# Patient Record
Sex: Female | Born: 1990 | Race: Black or African American | Hispanic: No | Marital: Married | Smoking: Never smoker
Health system: Southern US, Community
[De-identification: ages and names within clinical notes are randomized; demographics above are authoritative.]

## PROBLEM LIST (undated history)

## (undated) DIAGNOSIS — Z789 Other specified health status: Secondary | ICD-10-CM

## (undated) HISTORY — DX: Other specified health status: Z78.9

## (undated) HISTORY — PX: NO PAST SURGERIES: SHX2092

---

## 2016-06-17 ENCOUNTER — Emergency Department (HOSPITAL_COMMUNITY)
Admission: EM | Admit: 2016-06-17 | Discharge: 2016-06-17 | Disposition: A | Discharge status: Home / Self Care | Payer: No Typology Code available for payment source | Attending: Emergency Medicine | Admitting: Emergency Medicine

## 2016-06-17 ENCOUNTER — Emergency Department (HOSPITAL_COMMUNITY): Payer: No Typology Code available for payment source

## 2016-06-17 ENCOUNTER — Encounter (HOSPITAL_COMMUNITY): Payer: Self-pay | Admitting: Emergency Medicine

## 2016-06-17 DIAGNOSIS — S8992XD Unspecified injury of left lower leg, subsequent encounter: Secondary | ICD-10-CM | POA: Diagnosis present

## 2016-06-17 DIAGNOSIS — S8012XD Contusion of left lower leg, subsequent encounter: Secondary | ICD-10-CM

## 2016-06-17 NOTE — ED Notes (Signed)
Papers reviewed with interpreter on the phone and patient demonstrated the use of crutches with ortho tech and verbalizes understanding

## 2016-06-17 NOTE — Discharge Instructions (Signed)
There were no abnormalities on the x-ray today. You may have sustained a deep bruise. This type of injury can sometimes take several weeks to heal. Take 500 mg of naproxen every 12 hours or 800 mg of ibuprofen every 8 hours for the next 3 days. Take these medications with food to avoid upset stomach. Follow up with a primary care provider for any future management of these complaints. Keep the extremity elevated whenever possible. Crutches and Ace wrap for comfort. Weightbearing as tolerated.
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
## 2016-06-17 NOTE — Progress Notes (Signed)
Orthopedic Tech Progress Note Patient Details:  Erika Jimenez 12/10/1990 [id number]  Ortho Devices Type of Ortho Device: Crutches Ortho Device/Splint Interventions: Application   [name] 06/17/2016, 11:18 AM

## 2016-06-17 NOTE — Discharge Planning (Signed)
Pt up for discharge. EDCM reviewed chart for possible CM needs.  No needs identified or communicated.  

## 2016-06-17 NOTE — ED Notes (Signed)
Paged ortho and they are bringing the appropriate size crutches
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
## 2016-06-17 NOTE — Progress Notes (Signed)
Orthopedic Tech Progress Note Patient Details:  Erika Jimenez 01/12/1991 [id number]  Patient ID: Erika Jimenez, female   DOB: 09/20/1990, 26 y.o.   MRN: [id number]   [name], [name] 06/17/2016, 11:34 AM Interpreter Trula Orehristina [phone]

## 2016-07-12 ENCOUNTER — Other Ambulatory Visit: Payer: Self-pay | Admitting: Infectious Disease

## 2016-07-12 ENCOUNTER — Ambulatory Visit
Admission: RE | Admit: 2016-07-12 | Discharge: 2016-07-12 | Disposition: A | Discharge status: Home / Self Care | Payer: No Typology Code available for payment source | Source: Ambulatory Visit | Attending: Infectious Disease | Admitting: Infectious Disease

## 2016-07-12 DIAGNOSIS — R7612 Nonspecific reaction to cell mediated immunity measurement of gamma interferon antigen response without active tuberculosis: Secondary | ICD-10-CM
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
## 2017-10-22 IMAGING — CR DG CHEST 1V
1 series · 1 of 1 positions shown · non-contrast
Comparison: None.

CLINICAL DATA: Positive QuantiFERON test.

EXAM:
CHEST 1 VIEW

[w chest pa]
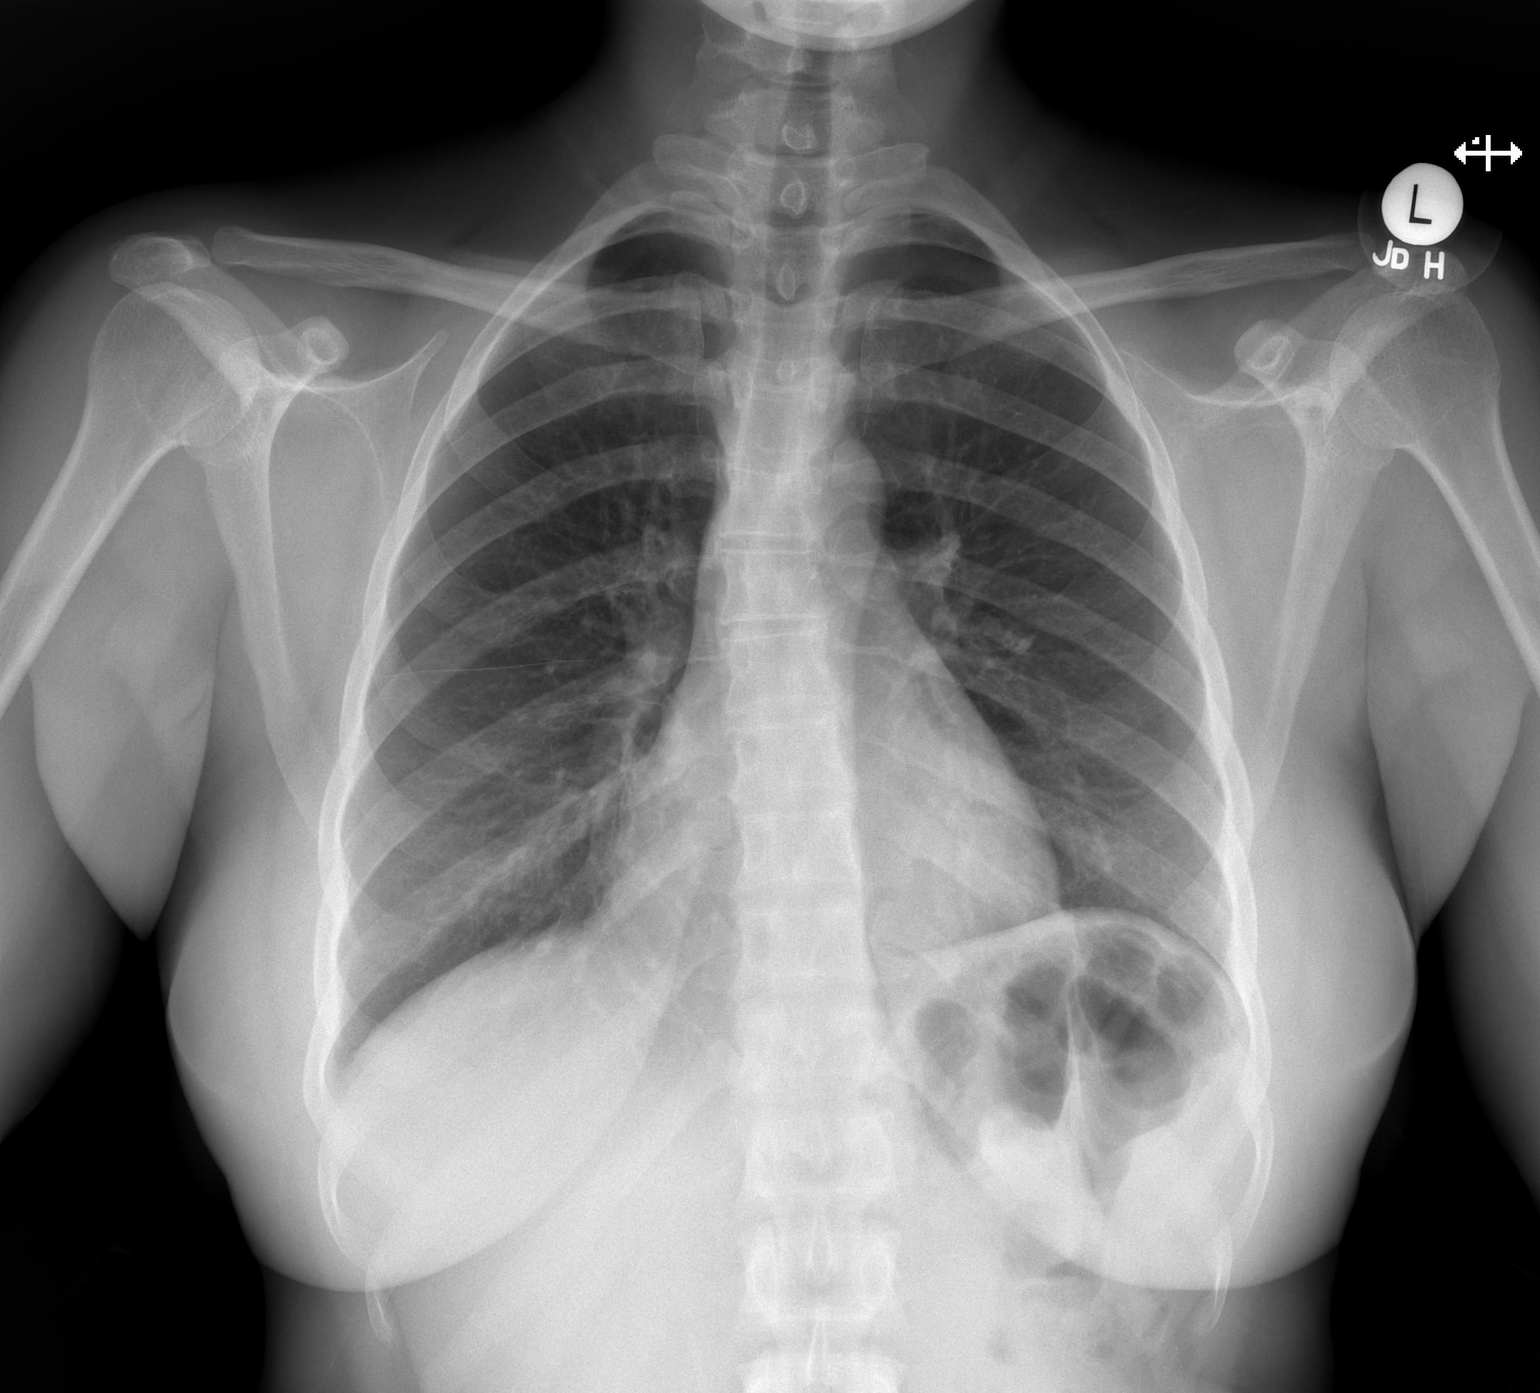

[1 of 1 positions shown; findings below may reference images not displayed]

FINDINGS: The heart size and mediastinal contours are within normal limits.
Both lungs are clear. The visualized skeletal structures are
unremarkable.
IMPRESSION: No active disease.

## 2022-06-03 NOTE — L&D Delivery Note (Signed)
Delivery Note At 0134 a viable female infant was delivered via SVD, presentation: LOA. APGAR: 8, 9; weight pending.   Placenta status: spontaneously delivered intact with gentle cord traction. Fundus firm with massage and Pitocin.   Anesthesia: none Lacerations: none Suture used for repair:n/a Est. Blood Loss (mL): 40 Placenta to LD Complications none Cord ph n/a   Mom to postpartum. Baby to Couplet care / Skin to Skin.    Julianne Handler, CNM 08/26/2022 1:57 AM

## 2022-08-05 ENCOUNTER — Other Ambulatory Visit: Payer: Self-pay | Admitting: Obstetrics & Gynecology

## 2022-08-05 DIAGNOSIS — Z363 Encounter for antenatal screening for malformations: Secondary | ICD-10-CM

## 2022-08-06 ENCOUNTER — Ambulatory Visit: Payer: 59 | Admitting: *Deleted

## 2022-08-06 ENCOUNTER — Ambulatory Visit: Payer: 59 | Attending: Obstetrics & Gynecology

## 2022-08-06 ENCOUNTER — Encounter: Payer: Self-pay | Admitting: *Deleted

## 2022-08-06 DIAGNOSIS — Z363 Encounter for antenatal screening for malformations: Secondary | ICD-10-CM

## 2022-08-23 ENCOUNTER — Ambulatory Visit: Payer: Self-pay

## 2022-08-26 ENCOUNTER — Encounter (HOSPITAL_COMMUNITY): Payer: Self-pay | Admitting: Obstetrics and Gynecology

## 2022-08-26 ENCOUNTER — Inpatient Hospital Stay (HOSPITAL_COMMUNITY)
Admission: AD | Admit: 2022-08-26 | Discharge: 2022-08-28 | DRG: 807 | Disposition: A | Discharge status: Home / Self Care | Payer: BC Managed Care – PPO | Attending: Obstetrics and Gynecology | Admitting: Obstetrics and Gynecology

## 2022-08-26 DIAGNOSIS — O9902 Anemia complicating childbirth: Secondary | ICD-10-CM | POA: Diagnosis present

## 2022-08-26 DIAGNOSIS — O26893 Other specified pregnancy related conditions, third trimester: Secondary | ICD-10-CM | POA: Diagnosis present

## 2022-08-26 DIAGNOSIS — Z3A39 39 weeks gestation of pregnancy: Secondary | ICD-10-CM

## 2022-08-26 DIAGNOSIS — O99019 Anemia complicating pregnancy, unspecified trimester: Secondary | ICD-10-CM | POA: Insufficient documentation

## 2022-08-26 DIAGNOSIS — O4202 Full-term premature rupture of membranes, onset of labor within 24 hours of rupture: Secondary | ICD-10-CM | POA: Diagnosis not present

## 2022-08-26 LAB — CBC
HCT: 29.8 % — ABNORMAL LOW (ref 36.0–46.0)
Hemoglobin: 9.7 g/dL — ABNORMAL LOW (ref 12.0–15.0)
MCH: 23.4 pg — ABNORMAL LOW (ref 26.0–34.0)
MCHC: 32.6 g/dL (ref 30.0–36.0)
MCV: 71.8 fL — ABNORMAL LOW (ref 80.0–100.0)
Platelets: 414 10*3/uL — ABNORMAL HIGH (ref 150–400)
RBC: 4.15 MIL/uL (ref 3.87–5.11)
RDW: 16.3 % — ABNORMAL HIGH (ref 11.5–15.5)
WBC: 12.3 10*3/uL — ABNORMAL HIGH (ref 4.0–10.5)
nRBC: 0 % (ref 0.0–0.2)

## 2022-08-26 LAB — RPR: RPR Ser Ql: NONREACTIVE

## 2022-08-26 MED ORDER — DIBUCAINE (PERIANAL) 1 % EX OINT: 1.0000 | TOPICAL_OINTMENT | CUTANEOUS | Status: DC | PRN

## 2022-08-26 MED ORDER — OXYTOCIN 10 UNIT/ML IJ SOLN
10.0000 [IU] | Freq: Once | INTRAMUSCULAR | Status: AC
  Administered 2022-08-26: 10 [IU] via INTRAMUSCULAR

## 2022-08-26 MED ORDER — ACETAMINOPHEN 325 MG PO TABS: 650.0000 mg | ORAL_TABLET | ORAL | Status: DC | PRN

## 2022-08-26 MED ORDER — ONDANSETRON HCL 4 MG PO TABS: 4.0000 mg | ORAL_TABLET | ORAL | Status: DC | PRN

## 2022-08-26 MED ORDER — BENZOCAINE-MENTHOL 20-0.5 % EX AERO: 1.0000 | INHALATION_SPRAY | CUTANEOUS | Status: DC | PRN

## 2022-08-26 MED ORDER — COCONUT OIL OIL
1.0000 | TOPICAL_OIL | Status: DC | PRN
  Administered 2022-08-27: 1 via TOPICAL

## 2022-08-26 MED ORDER — DIPHENHYDRAMINE HCL 25 MG PO CAPS: 25.0000 mg | ORAL_CAPSULE | Freq: Four times a day (QID) | ORAL | Status: DC | PRN

## 2022-08-26 MED ORDER — ONDANSETRON HCL 4 MG/2ML IJ SOLN: 4.0000 mg | INTRAMUSCULAR | Status: DC | PRN

## 2022-08-26 MED ORDER — WITCH HAZEL-GLYCERIN EX PADS: 1.0000 | MEDICATED_PAD | CUTANEOUS | Status: DC | PRN

## 2022-08-26 MED ORDER — IBUPROFEN 600 MG PO TABS
600.0000 mg | ORAL_TABLET | Freq: Four times a day (QID) | ORAL | Status: DC
  Administered 2022-08-26 – 2022-08-28 (×10): 600 mg via ORAL
  Filled 2022-08-26 (×10): qty 1

## 2022-08-26 MED ORDER — TETANUS-DIPHTH-ACELL PERTUSSIS 5-2.5-18.5 LF-MCG/0.5 IM SUSY: 0.5000 mL | PREFILLED_SYRINGE | Freq: Once | INTRAMUSCULAR | Status: DC

## 2022-08-26 MED ORDER — PRENATAL MULTIVITAMIN CH
1.0000 | ORAL_TABLET | Freq: Every day | ORAL | Status: DC
  Administered 2022-08-26 – 2022-08-28 (×3): 1 via ORAL
  Filled 2022-08-26 (×3): qty 1

## 2022-08-26 MED ORDER — MEASLES, MUMPS & RUBELLA VAC IJ SOLR: 0.5000 mL | Freq: Once | INTRAMUSCULAR | Status: DC

## 2022-08-26 MED ORDER — SIMETHICONE 80 MG PO CHEW: 80.0000 mg | CHEWABLE_TABLET | ORAL | Status: DC | PRN

## 2022-08-26 MED ORDER — SENNOSIDES-DOCUSATE SODIUM 8.6-50 MG PO TABS
2.0000 | ORAL_TABLET | ORAL | Status: DC
  Administered 2022-08-26 – 2022-08-27 (×2): 2 via ORAL
  Filled 2022-08-26 (×3): qty 2

## 2022-08-26 NOTE — MAU Note (Signed)
Pt pulled straight back to MAU exam room from lobby with urge to push. Pt is grossly ruptured with light meconium - reports SROM around 0015. Reports ctx started around 2300 last night.   Julianne Handler, CNM at bedside.  0130 pt actively pushing with noticeable crowning  0133 - FHR 133 by EFM  0134 - viable baby girl infant delivered   0140 - placenta delivered

## 2022-08-26 NOTE — H&P (Signed)
OBSTETRIC ADMISSION HISTORY AND PHYSICAL  Jobi Brazel is a 32 y.o. female G3P2002 with IUP at [redacted]w[redacted]d presenting for labor and SROM. She reports +FMs. No VB, blurry vision, headaches, peripheral edema, or RUQ pain. She plans on breastfeeding. She declines birth control.  Dating: By LMP --->  Estimated Date of Delivery: 09/02/22  Sono:    @[redacted]w[redacted]d , normal anatomy, 23%ile,    Prenatal History/Complications: - late/limited prenatal care - language barrier - anemia   Past Medical History: Past Medical History:  Diagnosis Date   Medical history non-contributory     Past Surgical History: Past Surgical History:  Procedure Laterality Date   NO PAST SURGERIES      Obstetrical History: OB History     Gravida  3   Para  2   Term  2   Preterm      AB      Living  2      SAB      IAB      Ectopic      Multiple      Live Births  2           Social History: Social History   Socioeconomic History   Marital status: Married    Spouse name: Not on file   Number of children: Not on file   Years of education: Not on file   Highest education level: Not on file  Occupational History   Not on file  Tobacco Use   Smoking status: Never   Smokeless tobacco: Never  Vaping Use   Vaping Use: Never used  Substance and Sexual Activity   Alcohol use: No   Drug use: No   Sexual activity: Not on file  Other Topics Concern   Not on file  Social History Narrative   Not on file   Social Determinants of Health   Financial Resource Strain: Not on file  Food Insecurity: Not on file  Transportation Needs: Not on file  Physical Activity: Not on file  Stress: Not on file  Social Connections: Not on file    Family History: Family History  Problem Relation Age of Onset   Diabetes Mother     Allergies: No Known Allergies  Medications Prior to Admission  Medication Sig Dispense Refill Last Dose   ferrous sulfate 325 (65 FE) MG EC tablet Take 325 mg by mouth  3 (three) times daily with meals.      Prenatal Vit-Fe Fumarate-FA (MULTIVITAMIN-PRENATAL) 27-0.8 MG TABS tablet Take 1 tablet by mouth daily at 12 noon.        Review of Systems:  All systems reviewed and negative except as stated in HPI  PE: Last menstrual period 11/26/2021. General appearance: alert, cooperative, and moderate distress Lungs: regular rate and effort Heart: regular rate  Abdomen: soft, non-tender Extremities: Homans sign is negative, no sign of DVT Presentation: cephalic EFM: 123456 bpm SVE: 10/+3  Prenatal labs: ABO, Rh:  O pos Antibody:  neg Rubella:  immune RPR:  NR  HBsAg:   NR HIV:   NR GBS:   neg 1 hr GTT normal  Prenatal Transfer Tool  Maternal Diabetes: No Genetic Screening: Declined Maternal Ultrasounds/Referrals: Normal Fetal Ultrasounds or other Referrals:  None Maternal Substance Abuse:  No Significant Maternal Medications:  None Significant Maternal Lab Results: Group B Strep negative  No results found for this or any previous visit (from the past 24 hour(s)).  There are no problems to display for this patient.  Assessment: Gaige Bradstreet is a 32 y.o. G3P2002 at [redacted]w[redacted]d here for labor  1. Labor: active 2. FWB: FHT only just prior to delivery 3. Pain: n/a 4. GBS: neg   Plan: Admit  Imminent delivery  Julianne Handler, CNM  08/26/2022, 1:48 AM

## 2022-08-26 NOTE — Lactation Note (Signed)
This note was copied from a baby's chart. Lactation Consultation Note  Patient Name: Erika Jimenez [id number] Today's Date: 08/26/2022 Age:33 hours Reason for consult: Initial assessment < 6 lbs.  Swahili interpreter used via video. P3, Mother breastfed her other children for 3 years. Discussed waking baby for feeding q 3 hours and offering breast before formula.  Mother latched baby and baby sustained latch after a few attempts.  Provided hand pump.   Feed on demand with cues.  Goal 8-12+ times per day after first 24 hrs.  Place baby STS if not cueing.  Lactation information sheet and volume guidelines reviewed.   Maternal Data Has patient been taught Hand Expression?: Yes Does the patient have breastfeeding experience prior to this delivery?: Yes How long did the patient breastfeed?: 3 years  Feeding Mother's Current Feeding Choice: Breast Milk and Formula  LATCH Score Latch: Repeated attempts needed to sustain latch, nipple held in mouth throughout feeding, stimulation needed to elicit sucking reflex.  Audible Swallowing: A few with stimulation  Type of Nipple: Everted at rest and after stimulation  Comfort (Breast/Nipple): Soft / non-tender  Hold (Positioning): Assistance needed to correctly position infant at breast and maintain latch.  LATCH Score: 7   Lactation Tools Discussed/Used Tools: Pump Breast pump type: Manual Pump Education: Setup, frequency, and cleaning Reason for Pumping: stimulation  Interventions Interventions: Assisted with latch;Skin to skin;Hand express;Hand pump;Education  Consult Status Consult Status: Follow-up Date: 08/27/22 Follow-up type: In-patient    Vivianne Master [facility] 08/26/2022, 10:36 AM

## 2022-08-26 NOTE — Discharge Summary (Signed)
Postpartum Discharge Summary  Date of Service updated***     Patient Name: Erika Jimenez DOB: Jun 23, 1990 MRN: [id number]  Date of admission: 08/26/2022 Delivery date:08/26/2022  Delivering provider: Julianne Handler  Date of discharge: 08/26/2022  Admitting diagnosis: Indication for care in labor and delivery, antepartum [O75.9] Intrauterine pregnancy: [redacted]w[redacted]d     Secondary diagnosis:  Principal Problem:   SVD (spontaneous vaginal delivery) Active Problems:   Indication for care in labor and delivery, antepartum  Additional problems: ***    Discharge diagnosis: Term Pregnancy Delivered                                              Post partum procedures:{Postpartum procedures:23558} Augmentation: N/A Complications: None  Hospital course: Onset of Labor With Vaginal Delivery      32 y.o. yo G3P3003 at [redacted]w[redacted]d was admitted in Active Labor on 08/26/2022. Labor course was complicated by none.  Membrane Rupture Time/Date: 12:15 AM ,08/26/2022   Delivery Method:Vaginal, Spontaneous  Episiotomy: None  Lacerations:  None  Patient had a postpartum course complicated by ***.  She is ambulating, tolerating a regular diet, passing flatus, and urinating well. Patient is discharged home in stable condition on 08/26/22.  Newborn Data: Birth date:08/26/2022  Birth time:1:34 AM  Gender:Female  Living status:Living  Apgars:8 ,9  Weight:2631 g   Magnesium Sulfate received: {Mag received:30440022} BMZ received: No Rhophylac:N/A MMR:N/A T-DaP:Given prenatally Flu: Yes Transfusion:{Transfusion received:30440034}  Physical exam  Vitals:   08/26/22 0145 08/26/22 0200 08/26/22 0215 08/26/22 0233  BP: 116/64 121/77 111/62 114/66  Pulse: 79 71 74 65   General: {Exam; general:21111117} Lochia: {Desc; appropriate/inappropriate:30686::"appropriate"} Uterine Fundus: {Desc; firm/soft:30687} Incision: {Exam; incision:21111123} DVT Evaluation: {Exam; dvt:2111122} Labs:No results found for: "WBC",  "HGB", "HCT", "MCV", "PLT"     No data to display         Edinburgh Score:     No data to display           After visit meds:  Allergies as of 08/26/2022   No Known Allergies   Med Rec must be completed prior to using this [facility]***        Discharge home in stable condition Infant Feeding: {Baby feeding:23562} Infant Disposition:{CHL IP OB HOME WITH DX:[id number] Discharge instruction: per After Visit Summary and Postpartum booklet. Activity: Advance as tolerated. Pelvic rest for 6 weeks.  Diet: {OB BY:630183 Future Appointments:No future appointments. Follow up Visit:f/up [facility]   08/26/2022 Shelda Pal, DO

## 2022-08-27 ENCOUNTER — Other Ambulatory Visit: Payer: Self-pay

## 2022-08-27 DIAGNOSIS — O99019 Anemia complicating pregnancy, unspecified trimester: Secondary | ICD-10-CM | POA: Insufficient documentation

## 2022-08-27 LAB — TYPE AND SCREEN
ABO/RH(D): O POS
Antibody Screen: NEGATIVE

## 2022-08-27 MED ORDER — SOD CITRATE-CITRIC ACID 500-334 MG/5ML PO SOLN: 30.0000 mL | ORAL | Status: DC | PRN

## 2022-08-27 MED ORDER — OXYCODONE-ACETAMINOPHEN 5-325 MG PO TABS: 1.0000 | ORAL_TABLET | ORAL | Status: DC | PRN

## 2022-08-27 MED ORDER — LACTATED RINGERS IV SOLN: 500.0000 mL | INTRAVENOUS | Status: DC | PRN

## 2022-08-27 MED ORDER — LACTATED RINGERS IV SOLN: INTRAVENOUS | Status: DC

## 2022-08-27 MED ORDER — ACETAMINOPHEN 325 MG PO TABS: 650.0000 mg | ORAL_TABLET | ORAL | Status: DC | PRN

## 2022-08-27 MED ORDER — OXYTOCIN-SODIUM CHLORIDE 30-0.9 UT/500ML-% IV SOLN: 2.5000 [IU]/h | INTRAVENOUS | Status: DC

## 2022-08-27 MED ORDER — OXYCODONE-ACETAMINOPHEN 5-325 MG PO TABS: 2.0000 | ORAL_TABLET | ORAL | Status: DC | PRN

## 2022-08-27 MED ORDER — LIDOCAINE HCL (PF) 1 % IJ SOLN: 30.0000 mL | INTRAMUSCULAR | Status: DC | PRN

## 2022-08-27 MED ORDER — OXYTOCIN BOLUS FROM INFUSION: 333.0000 mL | Freq: Once | INTRAVENOUS | Status: DC

## 2022-08-27 MED ORDER — ONDANSETRON HCL 4 MG/2ML IJ SOLN: 4.0000 mg | Freq: Four times a day (QID) | INTRAMUSCULAR | Status: DC | PRN

## 2022-08-27 NOTE — Progress Notes (Signed)
POSTPARTUM PROGRESS NOTE  Post Partum Day 1  Subjective:  Erika Jimenez is a 32 y.o. G3P3003 s/p SVD at [redacted]w[redacted]d.  She reports she is doing well. No acute events overnight. She denies any problems with ambulating, voiding or po intake. Denies nausea or vomiting.  Pain is well controlled.  Lochia is minimal.  Objective: Blood pressure 101/61, pulse 79, temperature 98.3 F (36.8 C), temperature source Oral, resp. rate 18, last menstrual period 11/26/2021, SpO2 100 %, unknown if currently breastfeeding.  Physical Exam:  General: alert, cooperative and no distress Chest: no respiratory distress Heart:regular rate, distal pulses intact Abdomen: soft, nontender,  Uterine Fundus: firm, appropriately tender DVT Evaluation: No calf swelling or tenderness Extremities: no edema Skin: warm, dry  Recent Labs    08/26/22 0811  HGB 9.7*  HCT 29.8*    Assessment/Plan: Erika Jimenez is a 32 y.o. [id number] s/p SVD at [redacted]w[redacted]d   PPD#1 - Doing well  Routine postpartum care  Contraception: still unsure Feeding: breast and bottle. Would like to work more with lactation team today. Dispo: Plan for discharge tomorrow. .   LOS: 1 day   Liliane Channel MD MPH OB Fellow, [place] for [place] 08/27/2022

## 2022-08-27 NOTE — Lactation Note (Signed)
This note was copied from a baby's chart. Lactation Consultation Note  Patient Name: Erika Jimenez Today's Date: 08/27/2022 Age:33 hours - mom speaks English and her sister in the room  Reason for consult: Follow-up assessment;Term;Infant < 6lbs;Breastfeeding assistance LC reviewed the doc flow sheets and the baby had not stooled since 1809 yesterday. Baby had a small to med green diaper and wet and then after breast feeding had a large med , green diaper.  LC offered to assist with latch and showed mom the football position, baby latched for 10 mins with multiple swallows and baby released on her own , nipple well rounded. Latch score 9 , @ 25 hours 7.5  After Breast feeding LC reviewed pace feeding, baby  only took 10 ml and that was with changing the nipple from the purple to the yellow.  Baby may need a speech consult for nipple change to be able to increase the volumes. MBURN aware of challenge.  [place] set up the DEBP since the Serum Bili is 7.5 at 25 hours.  #21 F flange fit the best. LC explained to mom the reason for the post pumping and that it can be a slow process. Per mom comfortable with the pumping .  LC sent a [facility] referral for a DEBP , pending.     Maternal Data Has patient been taught Hand Expression?: Yes  Feeding Mother's Current Feeding Choice: Breast Milk and Formula Nipple Type: Extra Slow Flow  LATCH Score Latch: Grasps breast easily, tongue down, lips flanged, rhythmical sucking.  Audible Swallowing: Spontaneous and intermittent  Type of Nipple: Everted at rest and after stimulation  Comfort (Breast/Nipple): Soft / non-tender  Hold (Positioning): Assistance needed to correctly position infant at breast and maintain latch.  LATCH Score: 9   Lactation Tools Discussed/Used Tools: Pump;Flanges Breast pump type: Double-Electric Breast Pump;Manual Pump Education: Milk Storage  Interventions Interventions: Breast feeding basics reviewed;Assisted with  latch;Skin to skin;Hand express;Breast compression;Adjust position;Support pillows;Position options;Hand pump;DEBP;Education  Discharge Pump: Manual  Consult Status Consult Status: Follow-up Date: 08/28/22 Follow-up type: In-patient    [place] 08/27/2022, 4:12 PM

## 2022-08-28 NOTE — Lactation Note (Signed)
This note was copied from a baby's chart. Lactation Consultation Note  Patient Name: Erika Jimenez [id number] Today's Date: 08/28/2022 Age:33 hours Reason for consult: Follow-up assessment;Infant weight loss;Term;Infant < 6lbs (Per sister translating - milk is coming in and baby recently BF . LC reviewed and updated the doc flow per sister and mom.) LC reviewed the BF D/C teaching and the [facility] resources.   Maternal Data    Feeding Mother's Current Feeding Choice: Breast Milk and Formula    Lactation Tools Discussed/Used Pump Education: Milk Storage  Interventions Interventions: Breast feeding basics reviewed;Hand pump;Education;LC Services brochure  Discharge Discharge Education: Engorgement and breast care;Warning signs for feeding baby;Outpatient recommendation;Other (comment) (mom declined [place] O/P) Pump: Personal;DEBP;Manual  Consult Status Consult Status: Complete Date: 08/28/22    Myer Haff 08/28/2022, 2:27 PM

## 2022-09-05 ENCOUNTER — Telehealth (HOSPITAL_COMMUNITY): Payer: Self-pay | Admitting: *Deleted

## 2022-09-05 NOTE — Telephone Encounter (Signed)
Attempted Hospital Discharge Follow-Up Call with help of interpreter "[place] [phone]".  No answer on patient phone.  Unable to leave a message.

## 2023-02-25 ENCOUNTER — Other Ambulatory Visit: Payer: Self-pay

## 2023-02-25 ENCOUNTER — Ambulatory Visit (INDEPENDENT_AMBULATORY_CARE_PROVIDER_SITE_OTHER): Payer: Medicaid Other

## 2023-02-25 ENCOUNTER — Ambulatory Visit (HOSPITAL_COMMUNITY)
Admission: EM | Admit: 2023-02-25 | Discharge: 2023-02-25 | Disposition: A | Discharge status: Home / Self Care | Payer: Medicaid Other | Attending: Family Medicine | Admitting: Family Medicine

## 2023-02-25 ENCOUNTER — Encounter (HOSPITAL_COMMUNITY): Payer: Self-pay | Admitting: *Deleted

## 2023-02-25 DIAGNOSIS — M25562 Pain in left knee: Secondary | ICD-10-CM

## 2023-02-25 DIAGNOSIS — H1013 Acute atopic conjunctivitis, bilateral: Secondary | ICD-10-CM

## 2023-02-25 LAB — POCT URINE PREGNANCY: Preg Test, Ur: NEGATIVE

## 2023-02-25 MED ORDER — IBUPROFEN 800 MG PO TABS: 800.0000 mg | ORAL_TABLET | Freq: Three times a day (TID) | ORAL | 0 refills | Status: AC | PRN

## 2023-02-25 MED ORDER — OLOPATADINE HCL 0.1 % OP SOLN: 1.0000 [drp] | Freq: Two times a day (BID) | OPHTHALMIC | 0 refills | Status: AC
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
## 2023-02-25 NOTE — Discharge Instructions (Addendum)
The x-ray did not show any bony abnormalities.  Most likely you have some soft tissue injury causing pain.(X-ray haikuonyesha upungufu wowote wa mifupa.  Uwezekano mkubwa zaidi una jeraha la tishu laini na kusababisha maumivu)   The pregnancy test was negative. (Mtihani wa ujauzito ulikuwa hasi)  Take ibuprofen 800 mg--1 tab every 8 hours as needed for pain. Henrene Pastor ibuprofen 800 mg--1 tab kila baada ya saa 8 kama inahitajika kwa ajili ya maumivu.)  Put drops in both eyes 2 times daily as needed for allergies. (Weka matone [place] macho yote [place] 2 kwa siku kama inahitajika kwa mzio.)
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
